# Patient Record
Sex: Male | Born: 1935 | Marital: Single
Health system: Southern US, Community
[De-identification: ages and names within clinical notes are randomized; demographics above are authoritative.]

---

## 2014-04-21 ENCOUNTER — Inpatient Hospital Stay: Payer: Self-pay | Admitting: Internal Medicine

## 2014-04-21 LAB — CBC
HCT: 45.5 % (ref 40.0–52.0)
HGB: 14.7 g/dL (ref 13.0–18.0)
MCH: 30.8 pg (ref 26.0–34.0)
MCHC: 32.3 g/dL (ref 32.0–36.0)
MCV: 96 fL (ref 80–100)
PLATELETS: 265 10*3/uL (ref 150–440)
RBC: 4.76 10*6/uL (ref 4.40–5.90)
RDW: 15.5 % — ABNORMAL HIGH (ref 11.5–14.5)
WBC: 10.7 10*3/uL — ABNORMAL HIGH (ref 3.8–10.6)

## 2014-04-21 LAB — COMPREHENSIVE METABOLIC PANEL
ALT: 109 U/L — AB
ANION GAP: 4 — AB (ref 7–16)
Albumin: 2.2 g/dL — ABNORMAL LOW (ref 3.4–5.0)
Alkaline Phosphatase: 118 U/L — ABNORMAL HIGH
BUN: 22 mg/dL — ABNORMAL HIGH (ref 7–18)
Bilirubin,Total: 0.4 mg/dL (ref 0.2–1.0)
CALCIUM: 8.4 mg/dL — AB (ref 8.5–10.1)
Chloride: 107 mmol/L (ref 98–107)
Co2: 32 mmol/L (ref 21–32)
Creatinine: 0.68 mg/dL (ref 0.60–1.30)
EGFR (African American): >60
Glucose: 151 mg/dL — ABNORMAL HIGH (ref 65–99)
Osmolality: 291 (ref 275–301)
POTASSIUM: 4.7 mmol/L (ref 3.5–5.1)
SGOT(AST): 81 U/L — ABNORMAL HIGH (ref 15–37)
SODIUM: 143 mmol/L (ref 136–145)
Total Protein: 6.9 g/dL (ref 6.4–8.2)

## 2014-04-21 LAB — URINALYSIS, COMPLETE
Bilirubin,UR: NEGATIVE
GLUCOSE, UR: NEGATIVE mg/dL (ref 0–75)
Ketone: NEGATIVE
Nitrite: POSITIVE
Ph: 5 (ref 4.5–8.0)
RBC,UR: 82 /HPF (ref 0–5)
Specific Gravity: 1.023 (ref 1.003–1.030)
Squamous Epithelial: NONE SEEN
Transitional Epi: 2
WBC UR: 606 /HPF (ref 0–5)

## 2014-04-21 LAB — TROPONIN I: Troponin-I: 0.02 ng/mL

## 2014-04-22 ENCOUNTER — Ambulatory Visit: Payer: Self-pay | Admitting: Internal Medicine

## 2014-04-22 LAB — BASIC METABOLIC PANEL
ANION GAP: 8 (ref 7–16)
BUN: 16 mg/dL (ref 7–18)
Calcium, Total: 8.2 mg/dL — ABNORMAL LOW (ref 8.5–10.1)
Chloride: 111 mmol/L — ABNORMAL HIGH (ref 98–107)
Co2: 25 mmol/L (ref 21–32)
Creatinine: 0.59 mg/dL — ABNORMAL LOW (ref 0.60–1.30)
EGFR (African American): >60
EGFR (Non-African Amer.): >60
Glucose: 107 mg/dL — ABNORMAL HIGH (ref 65–99)
OSMOLALITY: 288 (ref 275–301)
Potassium: 3.9 mmol/L (ref 3.5–5.1)
Sodium: 144 mmol/L (ref 136–145)

## 2014-04-22 LAB — CBC WITH DIFFERENTIAL/PLATELET
BASOS PCT: 0.4 %
Basophil #: 0 10*3/uL (ref 0.0–0.1)
Eosinophil #: 0.1 10*3/uL (ref 0.0–0.7)
Eosinophil %: 0.9 %
HCT: 37.9 % — AB (ref 40.0–52.0)
HGB: 12.4 g/dL — AB (ref 13.0–18.0)
LYMPHS ABS: 1.4 10*3/uL (ref 1.0–3.6)
Lymphocyte %: 14.3 %
MCH: 31.3 pg (ref 26.0–34.0)
MCHC: 32.7 g/dL (ref 32.0–36.0)
MCV: 96 fL (ref 80–100)
MONOS PCT: 9.8 %
Monocyte #: 0.9 x10 3/mm (ref 0.2–1.0)
NEUTROS ABS: 7.1 10*3/uL — AB (ref 1.4–6.5)
Neutrophil %: 74.6 %
PLATELETS: 223 10*3/uL (ref 150–440)
RBC: 3.97 10*6/uL — AB (ref 4.40–5.90)
RDW: 15.3 % — ABNORMAL HIGH (ref 11.5–14.5)
WBC: 9.6 10*3/uL (ref 3.8–10.6)

## 2014-04-22 LAB — MAGNESIUM: Magnesium: 2.1 mg/dL

## 2014-04-25 LAB — CBC WITH DIFFERENTIAL/PLATELET
BASOS PCT: 1 %
Basophil #: 0.1 10*3/uL (ref 0.0–0.1)
EOS ABS: 0.1 10*3/uL (ref 0.0–0.7)
Eosinophil %: 1.4 %
HCT: 33.7 % — ABNORMAL LOW (ref 40.0–52.0)
HGB: 10.8 g/dL — AB (ref 13.0–18.0)
LYMPHS ABS: 0.8 10*3/uL — AB (ref 1.0–3.6)
LYMPHS PCT: 12.4 %
MCH: 30.3 pg (ref 26.0–34.0)
MCHC: 32.2 g/dL (ref 32.0–36.0)
MCV: 94 fL (ref 80–100)
Monocyte #: 0.6 x10 3/mm (ref 0.2–1.0)
Monocyte %: 8.2 %
NEUTROS ABS: 5.3 10*3/uL (ref 1.4–6.5)
Neutrophil %: 77 %
PLATELETS: 217 10*3/uL (ref 150–440)
RBC: 3.58 10*6/uL — AB (ref 4.40–5.90)
RDW: 15.2 % — AB (ref 11.5–14.5)
WBC: 6.8 10*3/uL (ref 3.8–10.6)

## 2014-04-25 LAB — BASIC METABOLIC PANEL
Anion Gap: 9 (ref 7–16)
BUN: 5 mg/dL — ABNORMAL LOW (ref 7–18)
Calcium, Total: 6.8 mg/dL — CL (ref 8.5–10.1)
Chloride: 113 mmol/L — ABNORMAL HIGH (ref 98–107)
Co2: 22 mmol/L (ref 21–32)
Creatinine: 0.36 mg/dL — ABNORMAL LOW (ref 0.60–1.30)
EGFR (African American): >60
EGFR (Non-African Amer.): >60
Glucose: 78 mg/dL (ref 65–99)
Osmolality: 283 (ref 275–301)
Potassium: 2.7 mmol/L — ABNORMAL LOW (ref 3.5–5.1)
Sodium: 144 mmol/L (ref 136–145)

## 2014-04-25 LAB — MAGNESIUM: Magnesium: 1.6 mg/dL — ABNORMAL LOW

## 2014-04-26 LAB — CULTURE, BLOOD (SINGLE)

## 2014-04-26 LAB — BASIC METABOLIC PANEL
Anion Gap: 5 — ABNORMAL LOW (ref 7–16)
BUN: 5 mg/dL — ABNORMAL LOW (ref 7–18)
CALCIUM: 7.6 mg/dL — AB (ref 8.5–10.1)
Chloride: 113 mmol/L — ABNORMAL HIGH (ref 98–107)
Co2: 25 mmol/L (ref 21–32)
Creatinine: 0.49 mg/dL — ABNORMAL LOW (ref 0.60–1.30)
EGFR (African American): >60
EGFR (Non-African Amer.): >60
Glucose: 116 mg/dL — ABNORMAL HIGH (ref 65–99)
Osmolality: 283 (ref 275–301)
POTASSIUM: 3.4 mmol/L — AB (ref 3.5–5.1)
Sodium: 143 mmol/L (ref 136–145)

## 2014-04-26 LAB — ALBUMIN: ALBUMIN: 1.8 g/dL — AB (ref 3.4–5.0)

## 2014-04-27 LAB — URINE CULTURE

## 2014-04-28 LAB — BASIC METABOLIC PANEL
Anion Gap: 6 — ABNORMAL LOW (ref 7–16)
BUN: 6 mg/dL — ABNORMAL LOW (ref 7–18)
CREATININE: 0.37 mg/dL — AB (ref 0.60–1.30)
Calcium, Total: 7.9 mg/dL — ABNORMAL LOW (ref 8.5–10.1)
Chloride: 109 mmol/L — ABNORMAL HIGH (ref 98–107)
Co2: 24 mmol/L (ref 21–32)
EGFR (African American): >60
EGFR (Non-African Amer.): >60
GLUCOSE: 100 mg/dL — AB (ref 65–99)
Osmolality: 275 (ref 275–301)
Potassium: 4.1 mmol/L (ref 3.5–5.1)
SODIUM: 139 mmol/L (ref 136–145)

## 2014-04-28 LAB — HEMOGLOBIN: HGB: 12.9 g/dL — ABNORMAL LOW (ref 13.0–18.0)

## 2014-05-01 LAB — CREATININE, SERUM
Creatinine: 0.44 mg/dL — ABNORMAL LOW (ref 0.60–1.30)
EGFR (African American): >60

## 2014-05-03 LAB — PLATELET COUNT: Platelet: 207 10*3/uL (ref 150–440)

## 2014-05-23 ENCOUNTER — Ambulatory Visit: Payer: Self-pay | Admitting: Internal Medicine

## 2014-05-23 DEATH — deceased

## 2014-06-23 DEATH — deceased

## 2014-09-13 NOTE — H&P (Signed)
PATIENT NAMEMCDANIEL, OHMS MR#:  [id number] DATE OF BIRTH:  09-Jan-1927  DATE OF ADMISSION:  04/21/2014  PRIMARY CARE PHYSICIAN:  The patient is a resident of [facility]; Serita Sheller. Maryellen Pile, MD   REFERRING PHYSICIAN:  Sheran Fava. Fanny Bien, MD   CHIEF COMPLAINT:  Altered mental status.   HISTORY OF PRESENT ILLNESS:  Mr. Campi is an 81 year old male with a history of advanced dementia and previous history of stroke, who has been at [facility] since June 2015. At baseline, the patient recognizes the family members with names, however, does not carry on any conversation. In the last 2 weeks, the patient is noted to be more sluggish and does not participate in any conversation, worse from the baseline. However, when the patient's wife went to visit him yesterday, he was found to be more altered. He was sleeping most of the day. Concerning this, the patient was brought to the Emergency Department. On workup in the Emergency Department, the patient was found to have a urinary tract infection with 3+ leukocyte esterase, nitrite positive, WBC of 606, and 3+ bacteria. The patient is also found to have multiple sacral decubitus ulcers with drainage from the ulcers. Per wife, the patient was communicating some, however, also stated that since the patient received morphine, the patient is more somnolent. The patient is found to have elevated white blood cell count of 10.7. The main history is mainly obtained from the patient's wife, who is at bedside.   PAST MEDICAL HISTORY: 1.  Recurrent urinary tract infections.  2.  Dementia.  3.  History of rectal cancer, status post chemotherapy and radiation therapy.  4.  History of stroke.   PAST SURGICAL HISTORY:  1.  Suprapubic catheter.  2.  Colostomy.   ALLERGIES:  No known drug allergies.   HOME MEDICATIONS:  1.   20 mg 3 times a day.  2.  Santyl 250 units applied topically.  3.  Risperdal 1-1/2 tablets once a day.  4.  0.5 mg once a day.  5.   Nystop 100,000 units applied to the affected area.  6.  Megestrol 20 mL orally once a day.   SOCIAL HISTORY:  No history of smoking, drinking alcohol, or using illicit drugs. Married. Wife visits him every alternate day.   FAMILY HISTORY:  Multiple family members with various cancers and heart disease.   REVIEW OF SYSTEMS:  Could not be obtained from the patient secondary to altered mental status.   PHYSICAL EXAMINATION: GENERAL:  This is a chronically ill-looking male lying down in the bed, not in distress. Mild response to the painful stimuli, however, falls asleep. He does not follow any commands.  VITAL SIGNS:  Temperature 97.9, pulse 108, blood pressure 109/78, respiratory rate 20, oxygen saturation 99% on room air.  HEENT:  Head is normocephalic and atraumatic. There is no scleral icterus. Conjunctivae are normal. Pupils are equal and react to light. Mucous membranes:  Cannot examine.  NECK:  Supple. No lymphadenopathy. No JVD. No carotid bruit. No thyromegaly.  CHEST:  Has no focal tenderness. Decreased breath sounds in the lower lobes.  HEART:  S1 and S2, regular. No murmurs are heard.  ABDOMEN:  Bowel sounds present. Soft, nontender, nondistended.  EXTREMITIES:  No pedal edema. Pulses are 2+. In the sacral area, multiple ulcers. A large ulcer in the sacral area with purulent base with malodor present on admission and also multiple other pressure ulcers on both buttock areas.  NEUROLOGIC:  The patient is  not oriented to place, person, and time. Cannot examine the cranial nerves, motor, or sensory secondary to altered mental status.   LABORATORY AND RADIOGRAPHIC DATA:  Troponin is negative.   CMP shows mild elevation of the LFTs, alkaline phosphatase of 118, ALT 190, AST 81, and albumin 2.2.   CBC:  WBC of 10.5, the rest of all the values are within normal limits.   Urinalysis is strongly positive for urinary tract infection.   Lactic acid is 1.6.   Chest x-ray did not show any  obvious infiltrates.   Right upper quadrant ultrasound:  Has cholelithiasis without any evidence of cholecystitis.   ASSESSMENT AND PLAN:  Mr. Suzie Portelaayne is an 81 year old male with a history of dementia, who comes to the Emergency Department with altered mental status.   1.  Altered mental status. This is a combination of infection from the urinary tract infection as well as from the infected sacral decubitus ulcers. We will keep the patient on vancomycin and Zosyn. Blood cultures have been obtained. We will also hold the sedative medications. We will obtain a CT of the head without contrast considering the patient's previous history of stroke.  2.  Urinary tract infection. Urine cultures ordered. Continue with vancomycin and Zosyn. De-escalate the antibiotics once we have the culture data available.  3.  Infected sacral decubitus ulcer. Consulted wound care. Continue with antibiotics. The patient will need to have debridement of the wound.  4.  Advanced dementia, expected to get worse. However, it seems the patient was having some behavioral issues. The patient has been on Risperdal. Considering the patient's altered mental status, we will hold the Risperdal for now.  5.  Debility. Per wife, the patient mostly stays in the bed. We will re-evaluate.  6.  We will keep the patient on deep vein thrombosis prophylaxis with Lovenox.   TIME SPENT:  55 minutes.     ____________________________ Susa GriffinsPadmaja Evian Salguero, MD pv:nb D: 04/22/2014 03:47:41 ET T: 04/22/2014 04:10:32 ET JOB#: [id number]  cc: Susa GriffinsPadmaja Jahmeek Shirk, MD, <Dictator> Susa GriffinsPADMAJA Everhett Bozard MD ELECTRONICALLY SIGNED 05/02/2014 21:38

## 2014-09-13 NOTE — Discharge Summary (Signed)
[name]:  Jeffrey Fowler, Jeffrey Fowler MR#:  [id number] DATE OF BIRTH:  [birth year]  DATE OF ADMISSION:  04/21/2014 DATE OF DISCHARGE:  05/05/2014  [facility].    HOSPITAL COURSE:  Please refer to interim discharge summary dictated by Dr. Elpidio AnisSudini on 05/02/2014. Unfortunately the patient was not discharged on 05/02/2014 because the patient's family had difficulty coming to the hospital and signing the papers needed for hospice admission. The patient stayed in the hospital under comfort care measures until 05/05/2014. Today on 05/05/2014 he is stable to be discharged to hospice home. He has altered mental status and remains in altered mental status.   DISCHARGE MEDICATIONS: Santyl ointment topically to affected area once a day, olanzapine 10 mg at bedtime, lorazepam 0.5 mg 1-2 tablets every 2-4 hours as needed, morphine 20 mg in 1 mL oral concentrate 0.5 mL every 1-2 hours as needed, Zofran ODT 4 mg every 6 hours as needed.   The patient is DNR. His diet will be as tolerated.   ACTIVITY: As tolerated.   The patient's medications should be crushed or used in liquid when appropriate. Can be changed to rectal if unable to swallow. Oxygen as needed. The patient's Foley catheter should be placed and continued.   TIME SPENT:  40 minutes.     ____________________________ Katharina Caperima Jennye Runquist, MD rv:bu D: 05/05/2014 16:42:56 ET T: 05/05/2014 20:26:29 ET JOB#: [id number]  cc: Katharina Caperima Charl Wellen, MD, <Dictator> Theodor Mustin MD ELECTRONICALLY SIGNED 05/22/2014 17:15

## 2014-09-13 NOTE — Consult Note (Signed)
   Comments   [facility] to [facility]. Will initiate comfort care here. Transfer to 1C. [facility] wife is able to get here.   Electronic Signatures: Johnna Bollier, Harriett SineNancy (MD)  (Signed 11-Dec-15 15:48)  Authored: Palliative Care   Last Updated: 11-Dec-15 15:48 by Raegan Sipp, Harriett SineNancy (MD)

## 2014-09-13 NOTE — Consult Note (Signed)
Brief Consult Note: Diagnosis: sacral decubitus ulcer with necrotic base.   Patient was seen by consultant.   Discussed with Attending MD.   Comments: Severe dementia, sacral decubitus do not think debridement in best interest of the patient. can be discharged from my standpoint and follow up in wound care center.  Electronic Signatures: Natale LayBird, Jusitn Salsgiver (MD)  (Signed 04-Dec-15 14:01)  Authored: Brief Consult Note   Last Updated: 04-Dec-15 14:01 by Natale LayBird, Becki Mccaskill (MD)

## 2014-09-13 NOTE — Discharge Summary (Signed)
[name]:  Jeffrey Fowler, Jeffrey Fowler MR#:  [id number] DATE OF BIRTH:  Nov 20, 1926  DATE OF ADMISSION:  04/21/2014 DATE OF DISCHARGE:  05/02/2014   DISCHARGE DIAGNOSES:  1.  Acute encephalopathy over advanced dementia.  2.  Enterococcus urinary tract infection, catheter-related.  3.  Sacral decubitus ulcer with cellulitis.  4.  Severe malnutrition.  5.  Dehydration.  6.  Anemia of chronic disease.  7.  Dysphagia.  8.  Hypokalemia.    IMAGING STUDIES: Chest x-ray showed no acute disease.   HISTORY AND PHYSICAL: Please see detailed H and P dictated previously.  HOSPITAL COURSE: In brief, this is an 81 year old patient from a nursing home, presented to the hospital complaining of confusion. The patient was found to have a urinary tract infection, sacral decubitus ulcer with cellulitis, and was admitted to hospitalist service. The patient was initially treated with IV antibiotics with meropenem and had a PICC line placed, and needs antibiotics until 05/04/2014.   Surgery was constituted for debridement, but this was not thought to be necessary, secondary to the patient being sent to be hospice home and having poor prognosis with poor healing.   The patient was severely agitated, which is much improved with pain medications and sedative medications.   After consulting Dr. Harvie JuniorPhifer with palliative care, she discussed with the patient's wife, who has expressed an interest in transferring the patient to hospice home, which is being set up at this point. The patient has a poor prognosis, and further end-of-life care and hospice home, and bed is available.   DISCHARGE MEDICATIONS:  1.  Santyl 250 units topical ointment once a day.  2.  Olanzapine 10 mg oral once a day at bedtime.  3.  Lorazepam 0.5 mg 1-2 tablets every 2-4 hours as needed.  4.  Morphine 20 mg/mL,  0.5 mL every 1-2 hours.  5.  Zofran ODT 4 mg oral disintegrating tablet every 6 hours as needed for nausea or vomiting.   DISCHARGE INSTRUCTIONS:  Activity and food as tolerated.   CODE STATUS: DNR.  WOUND CARE: The patient needs daily dressing change of the sacral ulcer, applying Santyl ointment and moist dressing.   TIME SPENT TODAY ON THIS DISCHARGE ACTIVITY: 45 minutes.    ____________________________ Molinda BailiffSrikar R. Emilo Gras, MD srs:MT D: 05/02/2014 12:20:23 ET T: 05/02/2014 19:23:53 ET JOB#: [id number]  cc: Wardell HeathSrikar R. Lehua Flores, MD, <Dictator> Orie FishermanSRIKAR R Shaylen Nephew MD ELECTRONICALLY SIGNED 05/08/2014 13:09

## 2014-09-13 NOTE — Consult Note (Signed)
PATIENT NAMTerald Fowler:  Jeffrey Fowler MR#:  [id number] DATE OF BIRTH:  10-Jun-1926  DATE OF CONSULTATION:  04/25/2014  REFERRING PHYSICIAN:  [facility].  CONSULTING PHYSICIAN:  Umeka Wrench A. Egbert GaribaldiBird, MD  REASON FOR CONSULTATION: Decubitus ulcer.   HISTORY OF PRESENT ILLNESS: This is an 81 year old male with severe dementia, previous stroke, transferred from [facility] with altered mental status and admitted to the medical service on the 30th of November. The patient was found to have a large amount of leukocyte esterase in his urine with a urinary tract infection. The patient has a history of recurrent urinary tract infections, as well as a history of rectal cancer, status post chemotherapy and radiation. A decubitus ulcer was noted on admission. The patient has become very somnolent during his hospitalization. The confusion level has increased. He has been seen by palliative care medicine who has had discussions with the family.   ALLERGIES: None.   MEDICATIONS: Can be well defined in the chart.   PHYSICAL EXAMINATION: GENERAL: This is a chronically ill-and frail appearing male in bed responding only to really painful stimuli. He is noncommunicative on my examination with intermittent moaning.  SKIN: Examination of his sacrum demonstrates a 4 to 5 cm area of skin necrosis. There is necrotic subcutaneous tissue. There is malodorous present. A dressing was in place.   IMPRESSION:  1.  Sacral decubitus ulcer.  2.  Advanced dementia.  3.  H/o rectal CA.  RECOMMENDATIONS: At present, given the patient's mental status and clinical deterioration from his dementia and bedbound state, I would not recommend any further therapy at this time other than local wound care. I do not think that this decubitus ulcer is the source of his active infection issues. Discussed this with the patient's physician.  Recommend palliative care and hospice transfer.     ____________________________ Redge GainerMark A. Egbert GaribaldiBird,  MD FACS mab:at D: 04/26/2014 10:11:34 ET T: 04/26/2014 10:28:34 ET JOB#: [id number]  cc: Loraine LericheMark A. Egbert GaribaldiBird, MD, <Dictator> Raynald KempMARK A Rayshawn Maney MD ELECTRONICALLY SIGNED 04/26/2014 12:58

## 2014-09-17 NOTE — Consult Note (Signed)
PATIENT NAMTerald Fowler:  Jeffrey Fowler, Jeffrey Fowler MR#:  [id number] DATE OF BIRTH:  Mar 24, 1927  DATE OF CONSULTATION:  04/25/2014  REFERRING PHYSICIAN:   CONSULTING PHYSICIAN:  Stann Mainlandavid P. Sampson GoonFitzgerald, MD  REQUESTING PHYSICIAN:  Dr. Elisabeth PigeonVachhani,   REASON FOR CONSULTATION:  Decubitus ulcer.   HISTORY OF PRESENT ILLNESS: This is an 81 year old gentleman with history of advancing dementia, a resident at [facility] who has a history of prior stroke. [facility]. He was admitted with worsening mental status. He was found on admission to have a urinary tract infection as well as multiple sacral decubitus ulcers with drainage. The patient was admitted. He has been on antibiotics and seen by wound care. He has been seen by surgery who do not feel that debridement is in his best interest. We are consulted for further antibiotics.    PAST MEDICAL HISTORY:  1.  Dementia.  2.  CVA.  3.  History of rectal cancer status post chemotherapy and radiation therapy.  4.  Recurrent urinary tract infections.   PAST SURGICAL HISTORY: Suprapubic catheter and colostomy.   ALLERGIES: No known drug allergies.   SOCIAL HISTORY: Lives at [facility]. He is married. No tobacco, alcohol, or drugs.   FAMILY HISTORY: Noncontributory.   REVIEW OF SYSTEMS: Unable to be obtained.   ANTIBIOTICS SINCE ADMISSION: Include meropenem, Zosyn, and vancomycin.   PHYSICAL EXAMINATION:  VITAL SIGNS: Temperature 98.7, pulse 64, blood pressure 110/78, respirations 18, saturation 95% on room air. He has been afebrile since admission.  GENERAL: He is thin, disheveled, quite demented, lying in bed, minimally responsive, but in pain when examining his decubitus ulcers.  HEENT:  His oropharynx is clear.  NECK: Supple.  HEART: Regular.  LUNGS: Clear.  ABDOMEN: Soft, nondistended.  SKIN: He has a large sacral decubitus ulcer. He has also multiple other areas of skin breakdown.  NEUROLOGIC: He has advanced dementia,  uncooperative with the neurologic exam.   DATA: White blood count on admission 10.7, currently 6.8, hemoglobin 10.8, platelets 217,000. Urinalysis on admission 600 white cells. Urine culture grew greater than 100,000 Enterobacter sensitive to imipenem, intermediate to ceftriaxone and nitrofurantoin, otherwise resistant. Creatinine of 0.36. LFTs show slight elevation of alkaline phosphatase at 118, ALT 109, AST 81.   IMAGING: Ultrasound of his abdomen shows cholelithiasis without evidence of obstruction. Chest x-ray shows hyperexpanded lungs without acute cardiopulmonary disease.   IMPRESSION: An 81 year old gentleman admitted with altered mental status, urinary tract infection with multidrug-resistant Enterobacter, as well as multiple sacral decubitus ulcers that are infected. Clinically he has advanced dementia and is largely bedbound.   RECOMMENDATIONS:  1.  He will need a 10 day course of ertapenem for his urinary tract infection. This would also provide coverage for the sacral decubitus ulcer. Following that he will need good wound care to try to heal the ulcers. He is a very poor candidate for this as well as for surgery. His albumin is quite low at 2.2 and he will need adequate nutrition in order to heal. I would suggest palliation, however if family is aggressive would place a PICC and give 10 days of IV ertapenem and then follow clinically as an outpatient.  2.  Thank you for the consult. I will be glad to follow with you.    ____________________________ Stann Mainlandavid P. Sampson GoonFitzgerald, MD dpf:bu D: 04/25/2014 16:22:40 ET T: 04/25/2014 17:18:13 ET JOB#: [id number]  cc: Stann Mainlandavid P. Sampson GoonFitzgerald, MD, <Dictator> Richy Sampson GoonFITZGERALD MD ELECTRONICALLY SIGNED 05/25/2014 21:22

## 2016-04-06 IMAGING — CR DG CHEST 1V PORT
1 series · 1 of 1 positions shown · non-contrast
Comparison: None.

CLINICAL DATA: Weakness

EXAM:
PORTABLE CHEST - 1 VIEW

[ap]
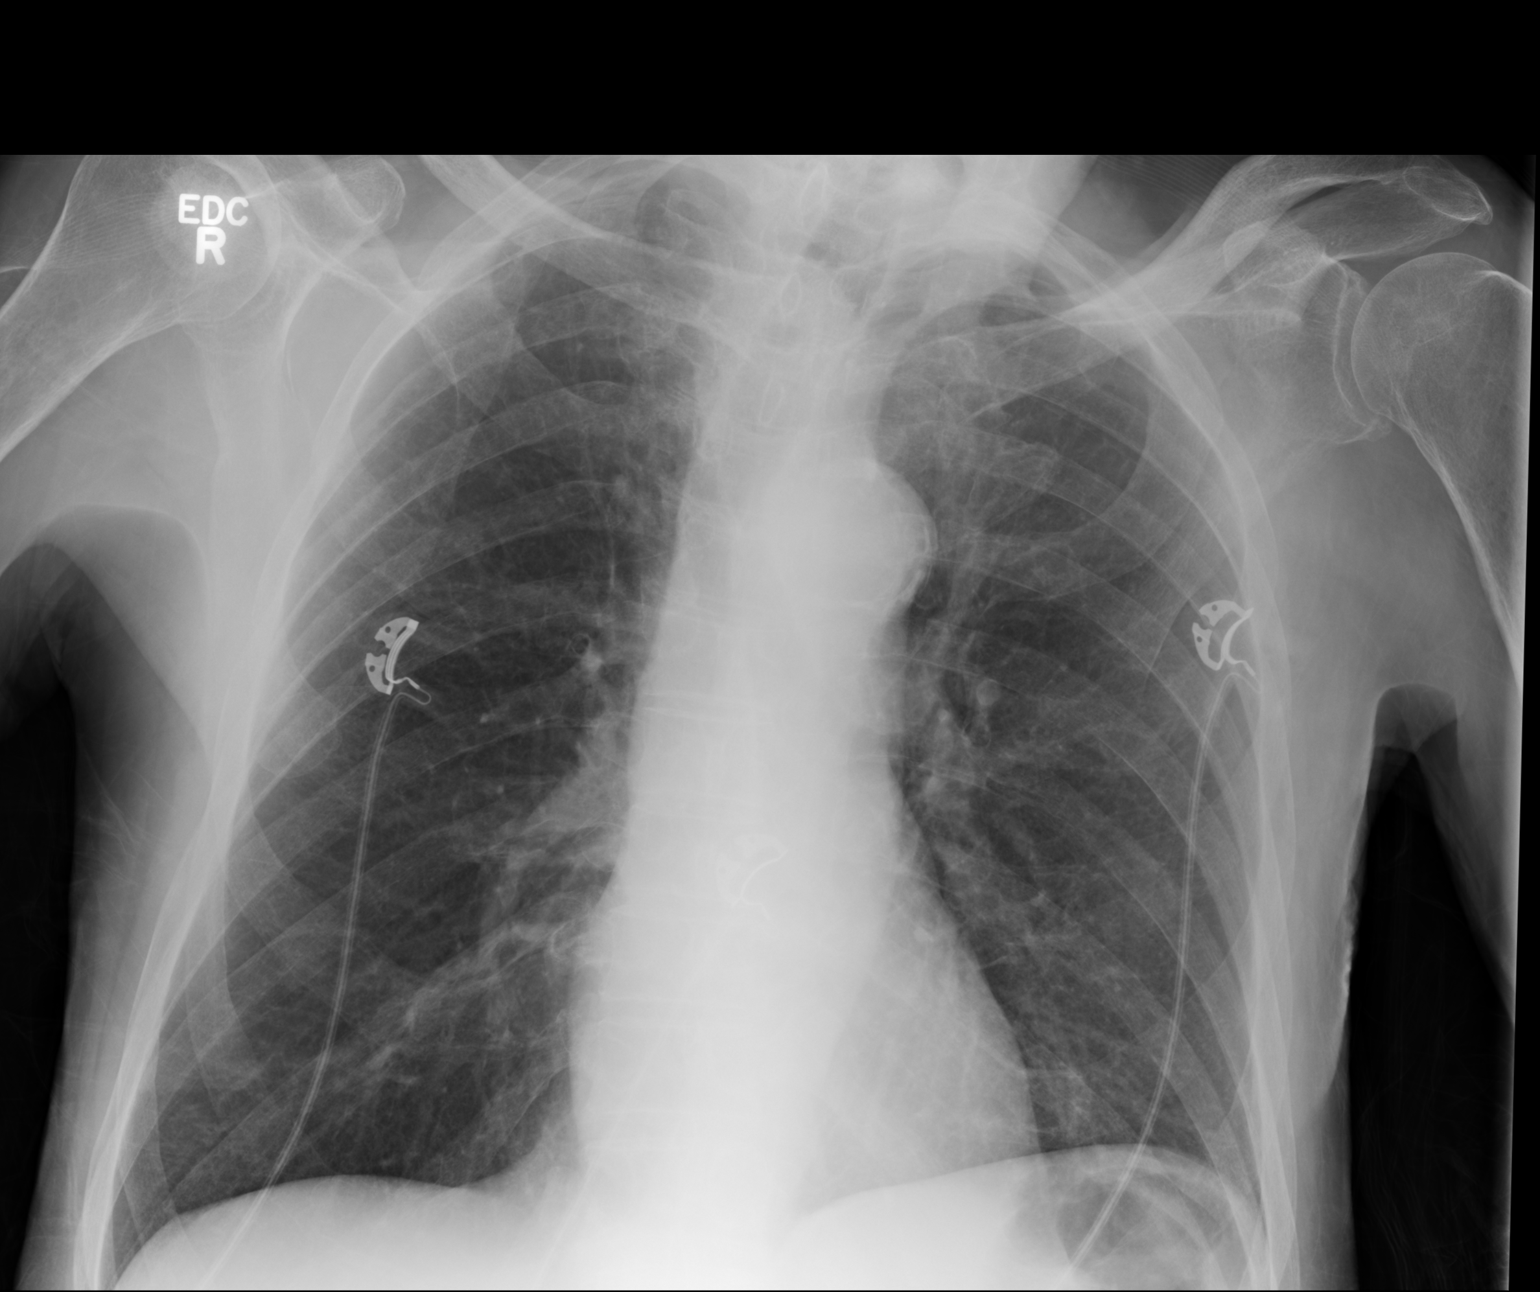

[1 of 1 positions shown; findings below may reference images not displayed]

FINDINGS: Normal cardiac silhouette. Atherosclerotic plaque within a
potentially mildly ectatic thoracic aorta. The lungs appear
hyperexpanded. No discrete focal airspace opacities. No definite
pleural effusion though note, the right costophrenic angles excluded
from view. No pneumothorax. No evidence of edema. No acute osseus
abnormalities.
IMPRESSION: Hyperexpanded lungs without acute cardiopulmonary disease.
